# Patient Record
Sex: Female | Born: 1970 | Race: White | Hispanic: No | Marital: Married | Smoking: Never smoker
Health system: Southern US, Community
[De-identification: ages and names within clinical notes are randomized; demographics above are authoritative.]

---

## 2018-09-17 ENCOUNTER — Emergency Department (HOSPITAL_COMMUNITY): Payer: BC Managed Care – PPO

## 2018-09-17 ENCOUNTER — Inpatient Hospital Stay (HOSPITAL_COMMUNITY)
Admission: EM | Admit: 2018-09-17 | Discharge: 2018-09-22 | DRG: 516 | Disposition: A | Discharge status: Home / Self Care | Payer: BC Managed Care – PPO | Attending: Surgery | Admitting: Surgery

## 2018-09-17 ENCOUNTER — Other Ambulatory Visit: Payer: Self-pay

## 2018-09-17 DIAGNOSIS — T148XXA Other injury of unspecified body region, initial encounter: Secondary | ICD-10-CM

## 2018-09-17 DIAGNOSIS — S2239XA Fracture of one rib, unspecified side, initial encounter for closed fracture: Secondary | ICD-10-CM | POA: Diagnosis present

## 2018-09-17 DIAGNOSIS — S82032A Displaced transverse fracture of left patella, initial encounter for closed fracture: Principal | ICD-10-CM | POA: Diagnosis present

## 2018-09-17 DIAGNOSIS — Y92410 Unspecified street and highway as the place of occurrence of the external cause: Secondary | ICD-10-CM | POA: Diagnosis not present

## 2018-09-17 DIAGNOSIS — Z90711 Acquired absence of uterus with remaining cervical stump: Secondary | ICD-10-CM

## 2018-09-17 DIAGNOSIS — Z20828 Contact with and (suspected) exposure to other viral communicable diseases: Secondary | ICD-10-CM | POA: Diagnosis present

## 2018-09-17 DIAGNOSIS — S2221XA Fracture of manubrium, initial encounter for closed fracture: Secondary | ICD-10-CM | POA: Diagnosis present

## 2018-09-17 DIAGNOSIS — F419 Anxiety disorder, unspecified: Secondary | ICD-10-CM | POA: Diagnosis present

## 2018-09-17 DIAGNOSIS — M25462 Effusion, left knee: Secondary | ICD-10-CM | POA: Diagnosis present

## 2018-09-17 DIAGNOSIS — Z79899 Other long term (current) drug therapy: Secondary | ICD-10-CM

## 2018-09-17 DIAGNOSIS — S82042A Displaced comminuted fracture of left patella, initial encounter for closed fracture: Secondary | ICD-10-CM

## 2018-09-17 DIAGNOSIS — S2243XA Multiple fractures of ribs, bilateral, initial encounter for closed fracture: Secondary | ICD-10-CM | POA: Diagnosis present

## 2018-09-17 DIAGNOSIS — S27321A Contusion of lung, unilateral, initial encounter: Secondary | ICD-10-CM | POA: Diagnosis present

## 2018-09-17 DIAGNOSIS — S270XXA Traumatic pneumothorax, initial encounter: Secondary | ICD-10-CM | POA: Diagnosis present

## 2018-09-17 DIAGNOSIS — S2249XA Multiple fractures of ribs, unspecified side, initial encounter for closed fracture: Secondary | ICD-10-CM | POA: Diagnosis present

## 2018-09-17 DIAGNOSIS — F988 Other specified behavioral and emotional disorders with onset usually occurring in childhood and adolescence: Secondary | ICD-10-CM | POA: Diagnosis present

## 2018-09-17 DIAGNOSIS — S27329A Contusion of lung, unspecified, initial encounter: Secondary | ICD-10-CM

## 2018-09-17 DIAGNOSIS — F329 Major depressive disorder, single episode, unspecified: Secondary | ICD-10-CM | POA: Diagnosis present

## 2018-09-17 DIAGNOSIS — K561 Intussusception: Secondary | ICD-10-CM | POA: Diagnosis present

## 2018-09-17 DIAGNOSIS — S82002A Unspecified fracture of left patella, initial encounter for closed fracture: Secondary | ICD-10-CM

## 2018-09-17 LAB — URINALYSIS, ROUTINE W REFLEX MICROSCOPIC
Bacteria, UA: NONE SEEN
Bilirubin Urine: NEGATIVE
Glucose, UA: NEGATIVE mg/dL
Hgb urine dipstick: NEGATIVE
Ketones, ur: NEGATIVE mg/dL
Nitrite: NEGATIVE
Protein, ur: NEGATIVE mg/dL
Specific Gravity, Urine: 1.016 (ref 1.005–1.030)
pH: 8 (ref 5.0–8.0)

## 2018-09-17 LAB — CBC WITH DIFFERENTIAL/PLATELET
Abs Immature Granulocytes: 0.14 10*3/uL — ABNORMAL HIGH (ref 0.00–0.07)
Basophils Absolute: 0.1 10*3/uL (ref 0.0–0.1)
Basophils Relative: 1 %
Eosinophils Absolute: 0.2 10*3/uL (ref 0.0–0.5)
Eosinophils Relative: 2 %
HCT: 40 % (ref 36.0–46.0)
Hemoglobin: 14 g/dL (ref 12.0–15.0)
Immature Granulocytes: 2 %
Lymphocytes Relative: 15 %
Lymphs Abs: 1.1 10*3/uL (ref 0.7–4.0)
MCH: 32.2 pg (ref 26.0–34.0)
MCHC: 35 g/dL (ref 30.0–36.0)
MCV: 92 fL (ref 80.0–100.0)
Monocytes Absolute: 0.5 10*3/uL (ref 0.1–1.0)
Monocytes Relative: 7 %
Neutro Abs: 5.3 10*3/uL (ref 1.7–7.7)
Neutrophils Relative %: 73 %
Platelets: 236 10*3/uL (ref 150–400)
RBC: 4.35 MIL/uL (ref 3.87–5.11)
RDW: 12.3 % (ref 11.5–15.5)
WBC: 7.2 10*3/uL (ref 4.0–10.5)
nRBC: 0 % (ref 0.0–0.2)

## 2018-09-17 LAB — COMPREHENSIVE METABOLIC PANEL
ALT: 19 U/L (ref 0–44)
AST: 26 U/L (ref 15–41)
Albumin: 4.1 g/dL (ref 3.5–5.0)
Alkaline Phosphatase: 70 U/L (ref 38–126)
Anion gap: 8 (ref 5–15)
BUN: 16 mg/dL (ref 6–20)
CO2: 26 mmol/L (ref 22–32)
Calcium: 9.3 mg/dL (ref 8.9–10.3)
Chloride: 100 mmol/L (ref 98–111)
Creatinine, Ser: 0.98 mg/dL (ref 0.44–1.00)
GFR calc Af Amer: >60 mL/min (ref >60)
GFR calc non Af Amer: >60 mL/min (ref >60)
Glucose, Bld: 116 mg/dL — ABNORMAL HIGH (ref 70–99)
Potassium: 3.7 mmol/L (ref 3.5–5.1)
Sodium: 134 mmol/L — ABNORMAL LOW (ref 135–145)
Total Bilirubin: 0.7 mg/dL (ref 0.3–1.2)
Total Protein: 6.6 g/dL (ref 6.5–8.1)

## 2018-09-17 MED ORDER — GABAPENTIN 300 MG PO CAPS
300.0000 mg | ORAL_CAPSULE | Freq: Three times a day (TID) | ORAL | Status: DC
  Administered 2018-09-18 – 2018-09-22 (×14): 300 mg via ORAL
  Filled 2018-09-17 (×14): qty 1

## 2018-09-17 MED ORDER — IOHEXOL 300 MG/ML  SOLN
100.0000 mL | Freq: Once | INTRAMUSCULAR | Status: AC | PRN
  Administered 2018-09-17: 100 mL via INTRAVENOUS

## 2018-09-17 MED ORDER — METHYLPHENIDATE HCL ER 18 MG PO TB24
72.0000 mg | ORAL_TABLET | Freq: Every morning | ORAL | Status: DC
  Administered 2018-09-19 – 2018-09-22 (×4): 72 mg via ORAL
  Filled 2018-09-17 (×4): qty 4

## 2018-09-17 MED ORDER — ENOXAPARIN SODIUM 40 MG/0.4ML ~~LOC~~ SOLN
40.0000 mg | SUBCUTANEOUS | Status: DC
  Administered 2018-09-19 – 2018-09-21 (×3): 40 mg via SUBCUTANEOUS
  Filled 2018-09-17 (×4): qty 0.4

## 2018-09-17 MED ORDER — SODIUM CHLORIDE 0.9 % IV BOLUS
500.0000 mL | Freq: Once | INTRAVENOUS | Status: AC
  Administered 2018-09-17: 500 mL via INTRAVENOUS

## 2018-09-17 MED ORDER — METOPROLOL TARTRATE 5 MG/5ML IV SOLN: 5.0000 mg | Freq: Four times a day (QID) | INTRAVENOUS | Status: DC | PRN

## 2018-09-17 MED ORDER — METHOCARBAMOL 500 MG PO TABS
500.0000 mg | ORAL_TABLET | Freq: Four times a day (QID) | ORAL | Status: DC | PRN
  Administered 2018-09-18 – 2018-09-19 (×3): 500 mg via ORAL
  Filled 2018-09-17 (×4): qty 1

## 2018-09-17 MED ORDER — IBUPROFEN 200 MG PO TABS
800.0000 mg | ORAL_TABLET | Freq: Three times a day (TID) | ORAL | Status: DC
  Administered 2018-09-18: 800 mg via ORAL
  Filled 2018-09-17: qty 4

## 2018-09-17 MED ORDER — METHOCARBAMOL 500 MG PO TABS: 500.0000 mg | ORAL_TABLET | Freq: Four times a day (QID) | ORAL | Status: DC | PRN

## 2018-09-17 MED ORDER — ONDANSETRON HCL 4 MG/2ML IJ SOLN
4.0000 mg | Freq: Four times a day (QID) | INTRAMUSCULAR | Status: DC | PRN
  Administered 2018-09-18 – 2018-09-21 (×3): 4 mg via INTRAVENOUS
  Filled 2018-09-17 (×3): qty 2

## 2018-09-17 MED ORDER — HYDROMORPHONE HCL 1 MG/ML IJ SOLN
0.5000 mg | INTRAMUSCULAR | Status: DC | PRN
  Administered 2018-09-17 – 2018-09-18 (×3): 0.5 mg via INTRAVENOUS
  Filled 2018-09-17 (×3): qty 1

## 2018-09-17 MED ORDER — PANTOPRAZOLE SODIUM 40 MG PO TBEC
40.0000 mg | DELAYED_RELEASE_TABLET | Freq: Every day | ORAL | Status: DC
  Administered 2018-09-20 – 2018-09-22 (×3): 40 mg via ORAL
  Filled 2018-09-17 (×3): qty 1

## 2018-09-17 MED ORDER — PANTOPRAZOLE SODIUM 40 MG IV SOLR
40.0000 mg | Freq: Every day | INTRAVENOUS | Status: DC
  Administered 2018-09-19: 40 mg via INTRAVENOUS
  Filled 2018-09-17: qty 40

## 2018-09-17 MED ORDER — BISACODYL 10 MG RE SUPP: 10.0000 mg | Freq: Every day | RECTAL | Status: DC | PRN

## 2018-09-17 MED ORDER — ONDANSETRON HCL 4 MG/2ML IJ SOLN
4.0000 mg | Freq: Once | INTRAMUSCULAR | Status: AC
  Administered 2018-09-17: 4 mg via INTRAVENOUS
  Filled 2018-09-17: qty 2

## 2018-09-17 MED ORDER — HYDRALAZINE HCL 20 MG/ML IJ SOLN: 10.0000 mg | INTRAMUSCULAR | Status: DC | PRN

## 2018-09-17 MED ORDER — ONDANSETRON 4 MG PO TBDP: 4.0000 mg | ORAL_TABLET | Freq: Four times a day (QID) | ORAL | Status: DC | PRN

## 2018-09-17 MED ORDER — HYDROMORPHONE HCL 1 MG/ML IJ SOLN
0.5000 mg | Freq: Once | INTRAMUSCULAR | Status: AC
  Administered 2018-09-17: 0.5 mg via INTRAVENOUS
  Filled 2018-09-17: qty 1

## 2018-09-17 MED ORDER — HYDROMORPHONE HCL 1 MG/ML IJ SOLN
0.2500 mg | Freq: Once | INTRAMUSCULAR | Status: AC
  Administered 2018-09-17: 0.25 mg via INTRAVENOUS
  Filled 2018-09-17: qty 1

## 2018-09-17 MED ORDER — HYDROMORPHONE HCL 1 MG/ML IJ SOLN
0.5000 mg | Freq: Once | INTRAMUSCULAR | Status: AC
  Administered 2018-09-17: 21:00:00 0.5 mg via INTRAVENOUS
  Filled 2018-09-17: qty 1

## 2018-09-17 MED ORDER — ACETAMINOPHEN 325 MG PO TABS
650.0000 mg | ORAL_TABLET | Freq: Four times a day (QID) | ORAL | Status: DC
  Administered 2018-09-18 – 2018-09-20 (×9): 650 mg via ORAL
  Filled 2018-09-17 (×9): qty 2

## 2018-09-17 MED ORDER — DOCUSATE SODIUM 100 MG PO CAPS
100.0000 mg | ORAL_CAPSULE | Freq: Two times a day (BID) | ORAL | Status: DC
  Administered 2018-09-18 – 2018-09-22 (×8): 100 mg via ORAL
  Filled 2018-09-17 (×8): qty 1

## 2018-09-17 MED ORDER — OXYCODONE HCL 5 MG PO TABS: 5.0000 mg | ORAL_TABLET | ORAL | Status: DC | PRN

## 2018-09-17 MED ORDER — SODIUM CHLORIDE 0.9 % IV SOLN
INTRAVENOUS | Status: DC
  Administered 2018-09-18 (×2): via INTRAVENOUS

## 2018-09-17 MED ORDER — BUPROPION HCL ER (XL) 150 MG PO TB24
300.0000 mg | ORAL_TABLET | Freq: Every day | ORAL | Status: DC
  Administered 2018-09-19 – 2018-09-22 (×4): 300 mg via ORAL
  Filled 2018-09-17 (×4): qty 2
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
## 2018-09-18 ENCOUNTER — Inpatient Hospital Stay (HOSPITAL_COMMUNITY): Payer: BC Managed Care – PPO

## 2018-09-18 ENCOUNTER — Inpatient Hospital Stay (HOSPITAL_COMMUNITY): Payer: BC Managed Care – PPO | Admitting: Anesthesiology

## 2018-09-18 ENCOUNTER — Encounter (HOSPITAL_COMMUNITY): Payer: Self-pay

## 2018-09-18 ENCOUNTER — Encounter (HOSPITAL_COMMUNITY): Admission: EM | Disposition: A | Discharge status: Home / Self Care | Payer: Self-pay | Source: Home / Self Care

## 2018-09-18 HISTORY — PX: ORIF PATELLA: SHX5033

## 2018-09-18 LAB — CBC
HCT: 36.8 % (ref 36.0–46.0)
Hemoglobin: 12.8 g/dL (ref 12.0–15.0)
MCH: 31.8 pg (ref 26.0–34.0)
MCHC: 34.8 g/dL (ref 30.0–36.0)
MCV: 91.3 fL (ref 80.0–100.0)
Platelets: 195 10*3/uL (ref 150–400)
RBC: 4.03 MIL/uL (ref 3.87–5.11)
RDW: 12.5 % (ref 11.5–15.5)
WBC: 6 10*3/uL (ref 4.0–10.5)
nRBC: 0 % (ref 0.0–0.2)

## 2018-09-18 LAB — HIV ANTIBODY (ROUTINE TESTING W REFLEX): HIV Screen 4th Generation wRfx: NONREACTIVE

## 2018-09-18 LAB — BASIC METABOLIC PANEL
Anion gap: 8 (ref 5–15)
BUN: 11 mg/dL (ref 6–20)
CO2: 25 mmol/L (ref 22–32)
Calcium: 8.9 mg/dL (ref 8.9–10.3)
Chloride: 102 mmol/L (ref 98–111)
Creatinine, Ser: 0.77 mg/dL (ref 0.44–1.00)
GFR calc Af Amer: >60 mL/min (ref >60)
GFR calc non Af Amer: >60 mL/min (ref >60)
Glucose, Bld: 125 mg/dL — ABNORMAL HIGH (ref 70–99)
Potassium: 3.7 mmol/L (ref 3.5–5.1)
Sodium: 135 mmol/L (ref 135–145)

## 2018-09-18 LAB — SARS CORONAVIRUS 2 (TAT 6-24 HRS): SARS Coronavirus 2: NEGATIVE

## 2018-09-18 LAB — SURGICAL PCR SCREEN
MRSA, PCR: NEGATIVE
Staphylococcus aureus: NEGATIVE

## 2018-09-18 LAB — MAGNESIUM: Magnesium: 2.1 mg/dL (ref 1.7–2.4)

## 2018-09-18 SURGERY — OPEN REDUCTION INTERNAL FIXATION (ORIF) PATELLA
Anesthesia: Spinal | Site: Knee | Laterality: Left

## 2018-09-18 MED ORDER — PROPOFOL 10 MG/ML IV BOLUS: INTRAVENOUS | Status: DC | PRN

## 2018-09-18 MED ORDER — 0.9 % SODIUM CHLORIDE (POUR BTL) OPTIME
TOPICAL | Status: DC | PRN
  Administered 2018-09-18: 1000 mL

## 2018-09-18 MED ORDER — KETOROLAC TROMETHAMINE 30 MG/ML IJ SOLN
30.0000 mg | Freq: Once | INTRAMUSCULAR | Status: AC
  Administered 2018-09-18: 30 mg via INTRAVENOUS
  Filled 2018-09-18: qty 1

## 2018-09-18 MED ORDER — VANCOMYCIN HCL 1000 MG IV SOLR: INTRAVENOUS | Status: DC | PRN

## 2018-09-18 MED ORDER — OXYCODONE HCL 5 MG PO TABS
5.0000 mg | ORAL_TABLET | ORAL | Status: DC | PRN
  Administered 2018-09-18 – 2018-09-19 (×3): 10 mg via ORAL
  Administered 2018-09-19: 5 mg via ORAL
  Administered 2018-09-19 (×2): 10 mg via ORAL
  Administered 2018-09-20 (×2): 5 mg via ORAL
  Administered 2018-09-20 – 2018-09-21 (×2): 10 mg via ORAL
  Administered 2018-09-21: 05:00:00 5 mg via ORAL
  Administered 2018-09-22: 10 mg via ORAL
  Filled 2018-09-18: qty 2
  Filled 2018-09-18: qty 1
  Filled 2018-09-18: qty 2
  Filled 2018-09-18: qty 1
  Filled 2018-09-18 (×5): qty 2
  Filled 2018-09-18: qty 1
  Filled 2018-09-18 (×3): qty 2

## 2018-09-18 MED ORDER — CEFAZOLIN SODIUM-DEXTROSE 2-4 GM/100ML-% IV SOLN
2.0000 g | Freq: Three times a day (TID) | INTRAVENOUS | Status: AC
  Administered 2018-09-18 – 2018-09-19 (×3): 2 g via INTRAVENOUS
  Filled 2018-09-18 (×3): qty 100

## 2018-09-18 MED ORDER — LACTATED RINGERS IV SOLN
INTRAVENOUS | Status: DC
  Administered 2018-09-18 (×2): via INTRAVENOUS

## 2018-09-18 MED ORDER — FENTANYL CITRATE (PF) 250 MCG/5ML IJ SOLN
INTRAMUSCULAR | Status: DC | PRN
  Administered 2018-09-18: 50 ug via INTRAVENOUS
  Administered 2018-09-18 (×2): 25 ug via INTRAVENOUS
  Administered 2018-09-18: 50 ug via INTRAVENOUS
  Administered 2018-09-18 (×2): 25 ug via INTRAVENOUS
  Administered 2018-09-18: 50 ug via INTRAVENOUS

## 2018-09-18 MED ORDER — VANCOMYCIN HCL 1000 MG IV SOLR
INTRAVENOUS | Status: AC
  Filled 2018-09-18: qty 1000

## 2018-09-18 MED ORDER — HYDROMORPHONE HCL 1 MG/ML IJ SOLN: 1.0000 mg | Freq: Once | INTRAMUSCULAR | Status: DC

## 2018-09-18 MED ORDER — MIDAZOLAM HCL 2 MG/2ML IJ SOLN
INTRAMUSCULAR | Status: DC | PRN
  Administered 2018-09-18: 2 mg via INTRAVENOUS

## 2018-09-18 MED ORDER — CEFAZOLIN SODIUM-DEXTROSE 2-3 GM-%(50ML) IV SOLR
INTRAVENOUS | Status: DC | PRN
  Administered 2018-09-18: 2 g via INTRAVENOUS

## 2018-09-18 MED ORDER — ONDANSETRON HCL 4 MG/2ML IJ SOLN
INTRAMUSCULAR | Status: DC | PRN
  Administered 2018-09-18: 4 mg via INTRAVENOUS

## 2018-09-18 MED ORDER — FENTANYL CITRATE (PF) 250 MCG/5ML IJ SOLN
INTRAMUSCULAR | Status: AC
  Filled 2018-09-18: qty 5

## 2018-09-18 MED ORDER — CEFAZOLIN SODIUM-DEXTROSE 2-4 GM/100ML-% IV SOLN
INTRAVENOUS | Status: AC
  Filled 2018-09-18: qty 100

## 2018-09-18 MED ORDER — HYDROMORPHONE HCL 1 MG/ML IJ SOLN: 0.2500 mg | INTRAMUSCULAR | Status: DC | PRN

## 2018-09-18 MED ORDER — ACETAMINOPHEN 500 MG PO TABS
1000.0000 mg | ORAL_TABLET | Freq: Once | ORAL | Status: AC
  Administered 2018-09-18: 1000 mg via ORAL
  Filled 2018-09-18: qty 2

## 2018-09-18 MED ORDER — PROMETHAZINE HCL 25 MG/ML IJ SOLN: 6.2500 mg | INTRAMUSCULAR | Status: DC | PRN

## 2018-09-18 MED ORDER — VANCOMYCIN HCL 1000 MG IV SOLR
INTRAVENOUS | Status: DC | PRN
  Administered 2018-09-18: 1000 mg via TOPICAL

## 2018-09-18 MED ORDER — SODIUM CHLORIDE 0.9 % IV SOLN
INTRAVENOUS | Status: DC | PRN
  Administered 2018-09-18: 14:00:00 25 ug/min via INTRAVENOUS

## 2018-09-18 MED ORDER — DEXAMETHASONE SODIUM PHOSPHATE 10 MG/ML IJ SOLN
INTRAMUSCULAR | Status: DC | PRN
  Administered 2018-09-18: 10 mg via INTRAVENOUS

## 2018-09-18 MED ORDER — BUPIVACAINE IN DEXTROSE 0.75-8.25 % IT SOLN
INTRATHECAL | Status: DC | PRN
  Administered 2018-09-18: 1.8 mL via INTRATHECAL

## 2018-09-18 MED ORDER — HYDROMORPHONE HCL 1 MG/ML IJ SOLN
1.0000 mg | INTRAMUSCULAR | Status: DC | PRN
  Administered 2018-09-18 – 2018-09-20 (×5): 1 mg via INTRAVENOUS
  Filled 2018-09-18 (×6): qty 1

## 2018-09-18 MED ORDER — BACITRACIN ZINC 500 UNIT/GM EX OINT
TOPICAL_OINTMENT | CUTANEOUS | Status: AC
  Filled 2018-09-18: qty 28.35

## 2018-09-18 MED ORDER — PROPOFOL 10 MG/ML IV BOLUS
INTRAVENOUS | Status: AC
  Filled 2018-09-18: qty 20

## 2018-09-18 MED ORDER — CEFAZOLIN SODIUM-DEXTROSE 2-4 GM/100ML-% IV SOLN: 2.0000 g | Freq: Once | INTRAVENOUS | Status: DC

## 2018-09-18 MED ORDER — MIDAZOLAM HCL 2 MG/2ML IJ SOLN
INTRAMUSCULAR | Status: AC
  Filled 2018-09-18: qty 2

## 2018-09-18 MED ORDER — PROPOFOL 500 MG/50ML IV EMUL
INTRAVENOUS | Status: DC | PRN
  Administered 2018-09-18: 25 ug/kg/min via INTRAVENOUS

## 2018-09-18 MED ORDER — HYDROMORPHONE BOLUS VIA INFUSION: 1.0000 mg | Freq: Once | INTRAVENOUS | Status: DC

## 2018-09-18 MED ORDER — PROPOFOL 10 MG/ML IV BOLUS
INTRAVENOUS | Status: DC | PRN
  Administered 2018-09-18 (×2): 20 mg via INTRAVENOUS

## 2018-09-18 MED ORDER — DIPHENHYDRAMINE HCL 25 MG PO CAPS
25.0000 mg | ORAL_CAPSULE | Freq: Four times a day (QID) | ORAL | Status: DC | PRN
  Administered 2018-09-18 – 2018-09-21 (×3): 25 mg via ORAL
  Filled 2018-09-18 (×3): qty 1

## 2018-09-18 SURGICAL SUPPLY — 63 items
BANDAGE ESMARK 6X9 LF (GAUZE/BANDAGES/DRESSINGS) IMPLANT
BLADE CLIPPER SURG (BLADE) ×1 IMPLANT
BNDG ELASTIC 4X5.8 VLCR STR LF (GAUZE/BANDAGES/DRESSINGS) ×1 IMPLANT
BNDG ELASTIC 6X10 VLCR STRL LF (GAUZE/BANDAGES/DRESSINGS) ×2 IMPLANT
BNDG ELASTIC 6X5.8 VLCR STR LF (GAUZE/BANDAGES/DRESSINGS) ×1 IMPLANT
BNDG ESMARK 6X9 LF (GAUZE/BANDAGES/DRESSINGS)
CHLORAPREP W/TINT 26 (MISCELLANEOUS) ×3 IMPLANT
CLOSURE WOUND 1/2 X4 (GAUZE/BANDAGES/DRESSINGS) ×1
COVER SURGICAL LIGHT HANDLE (MISCELLANEOUS) ×3 IMPLANT
COVER WAND RF STERILE (DRAPES) ×1 IMPLANT
CUFF TOURN SGL QUICK 34 (TOURNIQUET CUFF) ×2
CUFF TOURN SGL QUICK 42 (TOURNIQUET CUFF) IMPLANT
CUFF TRNQT CYL 34X4.125X (TOURNIQUET CUFF) ×1 IMPLANT
DECANTER SPIKE VIAL GLASS SM (MISCELLANEOUS) IMPLANT
DERMABOND ADVANCED (GAUZE/BANDAGES/DRESSINGS) ×2
DERMABOND ADVANCED .7 DNX12 (GAUZE/BANDAGES/DRESSINGS) ×1 IMPLANT
DRAPE C-ARM 42X72 X-RAY (DRAPES) ×3 IMPLANT
DRAPE C-ARMOR (DRAPES) ×3 IMPLANT
DRAPE HALF SHEET 40X57 (DRAPES) ×3 IMPLANT
DRAPE IMP U-DRAPE 54X76 (DRAPES) ×3 IMPLANT
DRAPE ORTHO SPLIT 77X108 STRL (DRAPES) ×4
DRAPE SURG ORHT 6 SPLT 77X108 (DRAPES) ×2 IMPLANT
DRAPE U-SHAPE 47X51 STRL (DRAPES) ×3 IMPLANT
ELECT REM PT RETURN 9FT ADLT (ELECTROSURGICAL) ×3
ELECTRODE REM PT RTRN 9FT ADLT (ELECTROSURGICAL) ×1 IMPLANT
GAUZE SPONGE 4X4 12PLY STRL (GAUZE/BANDAGES/DRESSINGS) ×2 IMPLANT
GLOVE BIO SURGEON STRL SZ 6.5 (GLOVE) ×6 IMPLANT
GLOVE BIO SURGEON STRL SZ7.5 (GLOVE) ×12 IMPLANT
GLOVE BIO SURGEONS STRL SZ 6.5 (GLOVE) ×3
GLOVE BIOGEL PI IND STRL 6.5 (GLOVE) ×1 IMPLANT
GLOVE BIOGEL PI IND STRL 7.5 (GLOVE) ×1 IMPLANT
GLOVE BIOGEL PI INDICATOR 6.5 (GLOVE) ×2
GLOVE BIOGEL PI INDICATOR 7.5 (GLOVE) ×2
GOWN STRL REUS W/ TWL LRG LVL3 (GOWN DISPOSABLE) ×2 IMPLANT
GOWN STRL REUS W/TWL LRG LVL3 (GOWN DISPOSABLE) ×4
IMMOBILIZER KNEE 22 UNIV (SOFTGOODS) ×3 IMPLANT
KIT BASIN OR (CUSTOM PROCEDURE TRAY) ×3 IMPLANT
KIT TURNOVER KIT B (KITS) ×3 IMPLANT
MANIFOLD NEPTUNE II (INSTRUMENTS) ×1 IMPLANT
NEEDLE 22X1 1/2 (OR ONLY) (NEEDLE) IMPLANT
NS IRRIG 1000ML POUR BTL (IV SOLUTION) ×3 IMPLANT
PACK GENERAL/GYN (CUSTOM PROCEDURE TRAY) ×3 IMPLANT
PAD ARMBOARD 7.5X6 YLW CONV (MISCELLANEOUS) ×6 IMPLANT
PAD CAST 4YDX4 CTTN HI CHSV (CAST SUPPLIES) IMPLANT
PADDING CAST COTTON 4X4 STRL (CAST SUPPLIES) ×2
RETRIEVER SUT HEWSON (MISCELLANEOUS) IMPLANT
STRIP CLOSURE SKIN 1/2X4 (GAUZE/BANDAGES/DRESSINGS) ×1 IMPLANT
SUT FIBERWIRE #2 38 T-5 BLUE (SUTURE)
SUT FIBERWIRE #5 38 CONV NDL (SUTURE) ×9
SUT MNCRL AB 3-0 PS2 18 (SUTURE) ×3 IMPLANT
SUT VIC AB 0 CT1 27 (SUTURE) ×2
SUT VIC AB 0 CT1 27XBRD ANBCTR (SUTURE) ×1 IMPLANT
SUT VIC AB 1 CT1 27 (SUTURE) ×6
SUT VIC AB 1 CT1 27XBRD ANBCTR (SUTURE) ×1 IMPLANT
SUT VIC AB 2-0 CT1 27 (SUTURE) ×6
SUT VIC AB 2-0 CT1 TAPERPNT 27 (SUTURE) ×1 IMPLANT
SUTURE FIBERWR #2 38 T-5 BLUE (SUTURE) IMPLANT
SUTURE FIBERWR #5 38 CONV NDL (SUTURE) IMPLANT
SYR CONTROL 10ML LL (SYRINGE) IMPLANT
TOWEL GREEN STERILE (TOWEL DISPOSABLE) ×3 IMPLANT
TRAY URETHRAL FOLEY CATH 14FR (CATHETERS) ×2 IMPLANT
UNDERPAD 30X30 (UNDERPADS AND DIAPERS) ×3 IMPLANT
WATER STERILE IRR 1000ML POUR (IV SOLUTION) ×3 IMPLANT

## 2018-09-18 NOTE — Progress Notes (Signed)
Patient left for surgery at this time. Informed consent signed by patient and CHG bath given. Transported by bed and husband in room.
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
## 2018-09-18 NOTE — Progress Notes (Signed)
PT Cancellation Note  Patient Details Name: Deborah Bowen MRN: [id number] DOB: [birth year]   Cancelled Treatment:    Reason Eval/Treat Not Completed: Patient at procedure or test/unavailable Pt for surgery today, will wait post op to evaluate patient.   Marguarite Arbour A Thia Olesen 09/18/2018, 9:02 AM Wray Kearns, PT, DPT Acute Rehabilitation Services Pager [phone] Office [phone]

## 2018-09-18 NOTE — Interval H&P Note (Signed)
History and Physical Interval Note:  09/18/2018 1:25 PM  Deborah Bowen  has presented today for surgery, with the diagnosis of Left patella fracture.  The various methods of treatment have been discussed with the patient and family. After consideration of risks, benefits and other options for treatment, the patient has consented to  Procedure(s): OPEN REDUCTION INTERNAL (ORIF) FIXATION PATELLA (Left) as a surgical intervention.  The patient's history has been reviewed, patient examined, no change in status, stable for surgery.  I have reviewed the patient's chart and labs.  Questions were answered to the patient's satisfaction.     Lennette Bihari P Haddix

## 2018-09-18 NOTE — Anesthesia Procedure Notes (Signed)
Spinal  Patient location during procedure: OR Start time: 09/18/2018 1:29 PM End time: 09/18/2018 1:39 PM Staffing Anesthesiologist: Duane Boston, MD Performed: anesthesiologist  Preanesthetic Checklist Completed: patient identified, surgical consent, pre-op evaluation, timeout performed, IV checked, risks and benefits discussed and monitors and equipment checked Spinal Block Patient position: sitting Prep: DuraPrep Patient monitoring: cardiac monitor, continuous pulse ox and blood pressure Approach: midline Location: L2-3 Injection technique: single-shot Needle Needle type: Pencan  Needle gauge: 24 G Needle length: 9 cm Additional Notes Functioning IV was confirmed and monitors were applied. Sterile prep and drape, including hand hygiene and sterile gloves were used. The patient was positioned and the spine was prepped. The skin was anesthetized with lidocaine.  Free flow of clear CSF was obtained prior to injecting local anesthetic into the CSF.  The spinal needle aspirated freely following injection.  The needle was carefully withdrawn.  The patient tolerated the procedure well.

## 2018-09-18 NOTE — Anesthesia Postprocedure Evaluation (Signed)
Anesthesia Post Note  Patient: Noelani Harbach  Procedure(s) Performed: OPEN REDUCTION INTERNAL (ORIF) FIXATION PATELLA (Left Knee)     Patient location during evaluation: PACU Anesthesia Type: Spinal Level of consciousness: awake and alert Pain management: pain level controlled Vital Signs Assessment: post-procedure vital signs reviewed and stable Respiratory status: spontaneous breathing and respiratory function stable Cardiovascular status: blood pressure returned to baseline and stable Postop Assessment: spinal receding Anesthetic complications: no    Last Vitals:  Vitals:   09/18/18 1545 09/18/18 1606  BP:    Pulse:    Resp:    Temp: 36.6 C (!) 36.3 C  SpO2:      Last Pain:  Vitals:   09/18/18 1606  TempSrc: Oral  PainSc:                  Quina Wilbourne DANIEL

## 2018-09-18 NOTE — OR Nursing (Signed)
1510 In and out catheter performed with  Peri care provided prior to catheterizing . A 14 french catheter 700 ml of clear yellow urine returned. Catheter removed and pt moved from OR table to pt. Bed. Pt. tolerated  procedure without any negative issues.
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
## 2018-09-18 NOTE — Progress Notes (Signed)
OT Cancellation Note  Patient Details Name: Deborah Bowen MRN: [id number] DOB: May 18, 1970   Cancelled Treatment:    Reason Eval/Treat Not Completed: Patient not medically ready. Pt for surgery today, will wait post op to evaluate patient.  [place], OTR/L Acute Rehab Services Pager [phone] Office [phone]     Almon Register 09/18/2018, 9:00 AM

## 2018-09-18 NOTE — Transfer of Care (Signed)
Immediate Anesthesia Transfer of Care Note  Patient: Deborah Bowen  Procedure(s) Performed: OPEN REDUCTION INTERNAL (ORIF) FIXATION PATELLA (Left Knee)  Patient Location: PACU  Anesthesia Type:Spinal  Level of Consciousness: awake, alert  and oriented  Airway & Oxygen Therapy: Patient Spontanous Breathing  Post-op Assessment: Report given to RN and Post -op Vital signs reviewed and stable  Post vital signs: Reviewed and stable  Last Vitals:  Vitals Value Taken Time  BP    Temp    Pulse    Resp    SpO2      Last Pain:  Vitals:   09/18/18 1123  TempSrc:   PainSc: 4          Complications: No apparent anesthesia complications

## 2018-09-18 NOTE — Progress Notes (Signed)
Orthopedic Tech Progress Note Patient Details:  Deborah Bowen 10/02/1970 [id number]  Patient ID: Deborah Bowen, female   DOB: [birth year], 48 y.o.   MRN: [id number]   Deborah Bowen 09/18/2018, 6:01 PMCalled [employer] for left hinged knee brace.
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
## 2018-09-19 LAB — CBC
HCT: 35.3 % — ABNORMAL LOW (ref 36.0–46.0)
Hemoglobin: 12.4 g/dL (ref 12.0–15.0)
MCH: 32 pg (ref 26.0–34.0)
MCHC: 35.1 g/dL (ref 30.0–36.0)
MCV: 91.2 fL (ref 80.0–100.0)
Platelets: 164 10*3/uL (ref 150–400)
RBC: 3.87 MIL/uL (ref 3.87–5.11)
RDW: 12.4 % (ref 11.5–15.5)
WBC: 5.3 10*3/uL (ref 4.0–10.5)
nRBC: 0 % (ref 0.0–0.2)

## 2018-09-19 MED ORDER — KCL IN DEXTROSE-NACL 20-5-0.45 MEQ/L-%-% IV SOLN
INTRAVENOUS | Status: DC
  Administered 2018-09-19 – 2018-09-20 (×2): via INTRAVENOUS
  Filled 2018-09-19 (×2): qty 1000

## 2018-09-19 MED ORDER — DIPHENHYDRAMINE HCL 50 MG/ML IJ SOLN
INTRAMUSCULAR | Status: AC
  Filled 2018-09-19: qty 1

## 2018-09-19 MED ORDER — PROMETHAZINE HCL 25 MG/ML IJ SOLN
12.5000 mg | Freq: Four times a day (QID) | INTRAMUSCULAR | Status: DC | PRN
  Administered 2018-09-19: 13:00:00 25 mg via INTRAVENOUS
  Filled 2018-09-19: qty 1
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
## 2018-09-20 ENCOUNTER — Encounter (HOSPITAL_COMMUNITY): Payer: Self-pay | Admitting: Student

## 2018-09-20 DIAGNOSIS — S82002A Unspecified fracture of left patella, initial encounter for closed fracture: Secondary | ICD-10-CM

## 2018-09-20 LAB — BASIC METABOLIC PANEL
Anion gap: 8 (ref 5–15)
BUN: 5 mg/dL — ABNORMAL LOW (ref 6–20)
CO2: 26 mmol/L (ref 22–32)
Calcium: 8.6 mg/dL — ABNORMAL LOW (ref 8.9–10.3)
Chloride: 104 mmol/L (ref 98–111)
Creatinine, Ser: 0.63 mg/dL (ref 0.44–1.00)
GFR calc Af Amer: >60 mL/min (ref >60)
GFR calc non Af Amer: >60 mL/min (ref >60)
Glucose, Bld: 119 mg/dL — ABNORMAL HIGH (ref 70–99)
Potassium: 3.8 mmol/L (ref 3.5–5.1)
Sodium: 138 mmol/L (ref 135–145)

## 2018-09-20 LAB — VITAMIN D 25 HYDROXY (VIT D DEFICIENCY, FRACTURES): Vit D, 25-Hydroxy: 30 ng/mL (ref 30.0–100.0)

## 2018-09-20 MED ORDER — TRAMADOL HCL 50 MG PO TABS
50.0000 mg | ORAL_TABLET | Freq: Four times a day (QID) | ORAL | Status: DC | PRN
  Administered 2018-09-21 – 2018-09-22 (×4): 50 mg via ORAL
  Filled 2018-09-20 (×4): qty 1

## 2018-09-20 MED ORDER — METHOCARBAMOL 750 MG PO TABS
750.0000 mg | ORAL_TABLET | Freq: Three times a day (TID) | ORAL | Status: DC
  Administered 2018-09-20 (×2): 750 mg via ORAL
  Administered 2018-09-20: 11:00:00 500 mg via ORAL
  Administered 2018-09-21 – 2018-09-22 (×4): 750 mg via ORAL
  Filled 2018-09-20 (×6): qty 1

## 2018-09-20 MED ORDER — ACETAMINOPHEN 500 MG PO TABS
1000.0000 mg | ORAL_TABLET | Freq: Four times a day (QID) | ORAL | Status: DC
  Administered 2018-09-20 – 2018-09-22 (×7): 1000 mg via ORAL
  Filled 2018-09-20 (×8): qty 2

## 2018-09-20 MED ORDER — VITAMIN D 25 MCG (1000 UNIT) PO TABS
2000.0000 [IU] | ORAL_TABLET | Freq: Every day | ORAL | Status: DC
  Administered 2018-09-20 – 2018-09-22 (×3): 2000 [IU] via ORAL
  Filled 2018-09-20 (×3): qty 2
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
## 2018-09-21 DIAGNOSIS — S27329A Contusion of lung, unspecified, initial encounter: Secondary | ICD-10-CM

## 2018-09-21 DIAGNOSIS — S2221XA Fracture of manubrium, initial encounter for closed fracture: Secondary | ICD-10-CM
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
## 2018-09-22 MED ORDER — OXYCODONE HCL 5 MG PO TABS: 5.0000 mg | ORAL_TABLET | Freq: Four times a day (QID) | ORAL | 0 refills | Status: AC | PRN

## 2018-09-22 MED ORDER — GABAPENTIN 300 MG PO CAPS: 300.0000 mg | ORAL_CAPSULE | Freq: Three times a day (TID) | ORAL | 0 refills | Status: AC

## 2018-09-22 MED ORDER — TRAMADOL HCL 50 MG PO TABS: 50.0000 mg | ORAL_TABLET | Freq: Four times a day (QID) | ORAL | 0 refills | Status: AC | PRN

## 2018-09-22 MED ORDER — METHOCARBAMOL 750 MG PO TABS: 750.0000 mg | ORAL_TABLET | Freq: Three times a day (TID) | ORAL | 0 refills | Status: AC

## 2018-09-22 MED ORDER — VITAMIN D3 25 MCG PO TABS: 2000.0000 [IU] | ORAL_TABLET | Freq: Every day | ORAL | Status: AC
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
## 2018-09-22 NOTE — Discharge Instructions (Signed)
How to Use a Knee Brace  A knee brace is a device that you wear to support your knee, especially if you have arthritis or the knee is healing after an injury or surgery. There are several types of knee braces. Some are designed to prevent an injury (prophylactic brace). These are often worn during sports. Others support an injured knee (functional brace) or keep it still while it heals (rehabilitative brace). People with severe arthritis of the knee may benefit from a brace that takes some pressure off the knee (unloader brace). Most knee braces are made from a combination of cloth and metal or plastic. You may need to wear a knee brace:  To relieve knee pain.  To help your knee support your weight (improve stability).  To help you walk farther, or to move more easily (improve mobility).  To prevent injury.  To support your knee while it heals from surgery or from an injury. What are the risks? Generally, knee braces are safe to wear. However, problems may occur, including:  Skin irritation that may cause pain and lead to infection.  Making your condition worse if you wear the brace in the wrong way. How to use a knee brace Different braces will have different instructions for use. Your health care provider will tell you or show you:  How to put on your brace.  How to adjust the brace.  When and how often to wear the brace.  How to remove the brace.  If you need any assistive devices in addition to the brace, such as crutches or a cane. In general, your brace should:  Have the hinge of the brace line up with the bend of your knee.  Have straps, hooks, or tapes that fasten snugly around your leg.  Not feel too tight or too loose. How to care for a knee brace  Check your brace often for signs of damage, such as loose connections or attachments. Your knee brace may get damaged or wear out during normal use.  Wash the fabric parts of your brace with soap and water.  Read the  insert that comes with your brace for other specific care instructions. Contact a health care provider if your knee brace:  Is too loose or too tight and you cannot adjust it.  Causes pain, skin redness, swelling, bruising, or irritation.  Is not helping to relieve your problem.  Is making your knee pain worse. Summary  A knee brace is a device that you wear to support your knee, especially if you have arthritis or your knee is healing after an injury or surgery.  Different braces will have different instructions for use. Your health care provider will tell you or show you how to use your knee brace.  Check your brace often for signs of damage, such as loose connections or attachments. Your knee brace may get damaged or wear out during normal use.  Wear your knee brace as told by your health care provider.  Contact a health care provider if your knee brace is not helping to relieve your problem or is making your knee pain worse. This information is not intended to replace advice given to you by your health care provider. Make sure you discuss any questions you have with your health care provider. Document Released: 04/19/2003 Document Revised: 08/12/2017 Document Reviewed: 08/12/2017 Elsevier Patient Education  2020 [employer]   Rib Fracture  A rib fracture is a break or crack in one of the bones  of the ribs. The ribs are like a cage that goes around your upper chest. A broken or cracked rib is often painful, but most do not cause other problems. Most rib fractures usually heal on their own in 1-3 months. Follow these instructions at home: Managing pain, stiffness, and swelling  If directed, apply ice to the injured area. ? Put ice in a plastic bag. ? Place a towel between your skin and the bag. ? Leave the ice on for 20 minutes, 2-3 times a day.  Take over-the-counter and prescription medicines only as told by your doctor. Activity  Avoid activities that cause pain to the  injured area. Protect your injured area.  Slowly increase activity as told by your doctor. General instructions  Do deep breathing as told by your doctor. You may be told to: ? Take deep breaths many times a day. ? Cough many times a day while hugging a pillow. ? Use a device (incentive spirometer) to do deep breathing many times a day.  Drink enough fluid to keep your pee (urine) clear or pale yellow.  Do not wear a rib belt or binder. These do not allow you to breathe deeply.  Keep all follow-up visits as told by your doctor. This is important. Contact a doctor if:  You have a fever. Get help right away if:  You have trouble breathing.  You are short of breath.  You cannot stop coughing.  You cough up thick or bloody spit (sputum).  You feel sick to your stomach (nauseous), throw up (vomit), or have belly (abdominal) pain.  Your pain gets worse and medicine does not help. Summary  A rib fracture is a break or crack in one of the bones of the ribs.  Apply ice to the injured area and take medicines for pain as told by your doctor.  Take deep breaths and cough many times a day. Hug a pillow every time you cough. This information is not intended to replace advice given to you by your health care provider. Make sure you discuss any questions you have with your health care provider. Document Released: 11/06/2007 Document Revised: 01/09/2017 Document Reviewed: 04/29/2016 Elsevier Patient Education  2020 [employer].   Sternal Fracture  A sternal fracture is a break in the bone in the center of the chest (sternum or breastbone). This type of fracture often causes pain that can get worse when you breathe deeply or cough. A sternal fracture is not dangerous unless there is also an injury to your heart or lungs, which are protected by the sternum and ribs. What are the causes? This condition is usually caused by a forceful injury from:  Motor vehicle accidents. This is the  most common cause.  Contact sports.  Physical assaults.  Falls. You can also develop a sternal fracture without having a forceful injury if the bone becomes weakened over time (stress fracture or insufficiency fracture). What increases the risk? You are more likely to have a sternal fracture if you:  Participate in contact sports, such as football, lacrosse, wrestling, or martial arts.  Work at elevated heights, such as in Architect. This increases your risk of a fall. The following factors may make you more likely to develop a stress fracture or insufficiency fracture:  Being female.  Being a postmenopausal woman.  Being 62 years of age or older.  Having weak bones (osteoporosis).  Having severe curvature of the spine.  Being on long-term steroid treatment. What are the signs or  symptoms? Symptoms of this condition include:  Pain over the sternum or chest wall.  Tenderness of the sternum or chest wall.  Pain that gets worse when you breathe deeply or cough.  Shortness of breath.  A bruise (contusion) over the chest.  Swelling.  A crackling sound when taking a deep breath or pressing on the sternum. How is this diagnosed? This condition is diagnosed based on:  A physical exam.  Your medical history.  Tests, such as: ? Blood oxygen level. This is measured with a pulse oximetry test. ? Repeated electrocardiograms (ECGs). This is to make sure that your heart is not injured. ? A blood test. This is to check for damage to your heart muscle. ? Imaging tests, such as:  A CT scan.  An ultrasound.  Chest X-rays. How is this treated? Treatment depends on the severity of your injury.  A sternal fracture without any other injury (isolated sternal fracture) usually heals without treatment. You may need to: ? Limit some activities at home. ? Take medicines for pain relief. ? Do deep breathing exercises to prevent injury and infection to your lungs.  In rare  cases, surgery may be needed if a sternal fracture: ? Continues to cause severe pain. ? Causes shortness of breath or respiratory problems. ? Involves bones that have been moved too far out of position (displaced fracture). Follow these instructions at home: Managing pain, stiffness, and swelling   If directed, put ice on the injured area. ? Put ice in a plastic bag. ? Place a towel between your skin and the bag. ? Leave the ice on for 20 minutes, 2-3 times a day. Medicines  Take over-the-counter and prescription medicines only as told by your health care provider.  Ask your health care provider if the medicine prescribed to you: ? Requires you to avoid driving or using heavy machinery. ? Can cause constipation. You may need to take actions to prevent or treat constipation, such as:  Drink enough fluid to keep your urine pale yellow.  Take over-the-counter or prescription medicines.  Eat foods that are high in fiber, such as beans, whole grains, and fresh fruits and vegetables.  Limit foods that are high in fat and processed sugars, such as fried or sweet foods. Activity  Rest at home.  Return to your normal activities as told by your health care provider. Ask your health care provider what activities are safe for you.  Do breathing exercises as told by your health care provider.  Do not push or pull with your arms when getting in and out of bed.  Do not lift anything that is heavier than 10 lb (4.5 kg), or the limit that you are told, until your health care provider says that it is safe. General instructions  Hug a pillow when you sneeze, cough, or twist or bend at the waist. Doing this helps support your chest.  Do not use any products that contain nicotine or tobacco, such as cigarettes, e-cigarettes, and chewing tobacco. These can delay bone healing. If you need help quitting, ask your health care provider.  Keep all follow-up visits as told by your health care  provider. This is important. Contact a health care provider if:  Your pain medicine is not helping.  You continue to have pain after several weeks.  You have swelling or bruising that gets worse.  You develop a fever or chills.  You develop a cough and you cough up thick or bloody mucus from your  lungs (sputum). Get help right away if you:  Have difficulty breathing.  Have chest pain.  Feel light-headed.  Have fast or irregular heartbeats (palpitations).  Feel nauseous or have pain in your abdomen. Summary  A sternal fracture is a break in the bone in the center of the chest (sternum or breastbone).  This condition is usually caused by a forceful injury. The most common cause is motor vehicle accidents.  If directed, put ice on the injured area.  Return to your normal activities as told by your health care provider. Ask your health care provider what activities are safe for you. This information is not intended to replace advice given to you by your health care provider. Make sure you discuss any questions you have with your health care provider. Document Released: 09/11/2003 Document Revised: 01/28/2018 Document Reviewed: 01/28/2018 Elsevier Patient Education  2020 [employer]
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
## 2018-09-22 NOTE — Progress Notes (Signed)
Pt given d/c education and all questions answered. No printed prescriptions to give. Equipment delivered to bedside. Outpatient PT note and work note placed in d/c packet. Iv removed. Pt taken to car with all belongings.
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
